# Patient Record
Sex: Male | Born: 1970 | Race: White | Hispanic: No | Marital: Single
Health system: Midwestern US, Community
[De-identification: ages and names within clinical notes are randomized; demographics above are authoritative.]

## PROBLEM LIST (undated history)

## (undated) DIAGNOSIS — D751 Secondary polycythemia: Secondary | ICD-10-CM

---

## 2014-12-07 ENCOUNTER — Inpatient Hospital Stay
Admit: 2014-12-07 | Discharge: 2014-12-07 | Disposition: A | Discharge status: Home / Self Care | Payer: Self-pay | Attending: Emergency Medicine

## 2014-12-07 DIAGNOSIS — M545 Low back pain: Secondary | ICD-10-CM

## 2014-12-07 LAB — METABOLIC PANEL, COMPREHENSIVE
A-G Ratio: 1.1 — ABNORMAL LOW (ref 1.2–3.5)
ALT (SGPT): 34 U/L (ref 12–65)
AST (SGOT): 22 U/L (ref 15–37)
Albumin: 3.2 g/dL — ABNORMAL LOW (ref 3.5–5.0)
Alk. phosphatase: 72 U/L (ref 50–136)
Anion gap: 5 mmol/L — ABNORMAL LOW (ref 7–16)
BUN: 11 MG/DL (ref 6–23)
Bilirubin, total: 0.5 MG/DL (ref 0.2–1.1)
CO2: 26 mmol/L (ref 21–32)
Calcium: 8.1 MG/DL — ABNORMAL LOW (ref 8.3–10.4)
Chloride: 113 mmol/L — ABNORMAL HIGH (ref 98–107)
Creatinine: 1.3 MG/DL (ref 0.8–1.5)
GFR est AA: >60 mL/min/{1.73_m2} (ref >60)
GFR est non-AA: >60 mL/min/{1.73_m2} (ref >60)
Globulin: 2.9 g/dL (ref 2.3–3.5)
Glucose: 119 mg/dL — ABNORMAL HIGH (ref 65–100)
Potassium: 3.6 mmol/L (ref 3.5–5.1)
Protein, total: 6.1 g/dL — ABNORMAL LOW (ref 6.3–8.2)
Sodium: 144 mmol/L (ref 136–145)

## 2014-12-07 LAB — CBC WITH AUTOMATED DIFF
ABS. BASOPHILS: 0 10*3/uL (ref 0.0–0.2)
ABS. EOSINOPHILS: 0.2 10*3/uL (ref 0.0–0.8)
ABS. IMM. GRANS.: 0 10*3/uL (ref 0.0–0.5)
ABS. LYMPHOCYTES: 2.4 10*3/uL (ref 0.5–4.6)
ABS. MONOCYTES: 0.6 10*3/uL (ref 0.1–1.3)
ABS. NEUTROPHILS: 5.2 10*3/uL (ref 1.7–8.2)
BASOPHILS: 0 % (ref 0.0–2.0)
EOSINOPHILS: 3 % (ref 0.5–7.8)
HCT: 47.6 % (ref 41.1–50.3)
HGB: 16.2 g/dL (ref 13.6–17.2)
IMMATURE GRANULOCYTES: 0.1 % (ref 0.0–5.0)
LYMPHOCYTES: 28 % (ref 13–44)
MCH: 30.3 PG (ref 26.1–32.9)
MCHC: 34 g/dL (ref 31.4–35.0)
MCV: 89 FL (ref 79.6–97.8)
MONOCYTES: 7 % (ref 4.0–12.0)
MPV: 10 FL — ABNORMAL LOW (ref 10.8–14.1)
NEUTROPHILS: 62 % (ref 43–78)
PLATELET: 233 10*3/uL (ref 150–450)
RBC: 5.35 M/uL (ref 4.23–5.67)
RDW: 13.3 % (ref 11.9–14.6)
WBC: 8.4 10*3/uL (ref 4.3–11.1)

## 2014-12-07 MED ORDER — SODIUM CHLORIDE 0.9 % IJ SYRG
INTRAMUSCULAR | Status: DC | PRN
Start: 2014-12-07 — End: 2014-12-07

## 2014-12-07 MED ORDER — SODIUM CHLORIDE 0.9 % IJ SYRG
Freq: Three times a day (TID) | INTRAMUSCULAR | Status: DC
Start: 2014-12-07 — End: 2014-12-07

## 2014-12-07 NOTE — ED Notes (Addendum)
Reports woke up at 5:35 with numbness to both legs.  EMS reports patient ambulatory on scene.  Patient reports difficulty getting legs to function.

## 2014-12-07 NOTE — ED Provider Notes (Addendum)
Patient is a 44 y.o. male presenting with numbness and anal bleeding. The history is provided by the patient.   Numbness  This is a new problem. The current episode started 6 to 12 hours ago. The problem has not changed since onset.There was right lower extremity and left lower extremity focality noted. Primary symptoms include focal weakness (bilateral lower extremity weakness).There has been no fever. Pertinent negatives include no shortness of breath, no chest pain, no vomiting, no headaches and no nausea. Associated symptoms comments: Chronic low back pain.   Rectal Bleeding   This is a new problem. Episode onset: 2 weeks ago. Stool description: Black. Pertinent negatives include no abdominal pain, no dysuria, no chills, no fever, no nausea, no back pain, no vomiting and no diarrhea. Risk factors: patient gives a history of peptic ulcer disease.        Past Medical History:   Diagnosis Date   ??? Psychiatric disorder    ??? PUD (peptic ulcer disease)    ??? Ill-defined condition        Past Surgical History:   Procedure Laterality Date   ??? Hx heent       eye         History reviewed. No pertinent family history.    History     Social History   ??? Marital Status: N/A     Spouse Name: N/A   ??? Number of Children: N/A   ??? Years of Education: N/A     Occupational History   ??? Not on file.     Social History Main Topics   ??? Smoking status: Never Smoker    ??? Smokeless tobacco: Not on file   ??? Alcohol Use: Yes   ??? Drug Use: Yes     Special: Marijuana   ??? Sexual Activity: Not on file     Other Topics Concern   ??? Not on file     Social History Narrative   ??? No narrative on file           ALLERGIES: Seroquel and Trazodone      Review of Systems   Constitutional: Negative for fever and chills.   HENT: Negative for congestion, rhinorrhea and sore throat.    Eyes: Negative for photophobia and redness.   Respiratory: Negative for cough and shortness of breath.    Cardiovascular: Negative for chest pain and leg swelling.    Gastrointestinal: Positive for anal bleeding. Negative for nausea, vomiting, abdominal pain and diarrhea.   Endocrine: Negative for polydipsia and polyuria.   Genitourinary: Negative for dysuria, urgency, frequency and difficulty urinating.        Denies urinary retention   Musculoskeletal: Positive for gait problem. Negative for myalgias, back pain and neck pain.   Neurological: Positive for focal weakness (bilateral lower extremity weakness) and numbness. Negative for weakness and headaches.       Filed Vitals:    12/07/14 0917   BP: 127/70   Pulse: 61   Temp: 98 ??F (36.7 ??C)   Resp: 18   Height: '5\' 10"'  (1.778 m)   Weight: 97.523 kg (215 lb)   SpO2: 94%            Physical Exam   Constitutional: He is oriented to person, place, and time. He appears well-developed and well-nourished.   HENT:   Mouth/Throat: Oropharynx is clear and moist. No oropharyngeal exudate.   Eyes: Conjunctivae are normal. Pupils are equal, round, and reactive to light.   Neck:  Neck supple.   Cardiovascular: Normal rate, regular rhythm and normal heart sounds.    Pulmonary/Chest: Effort normal and breath sounds normal.   Abdominal: Soft. Bowel sounds are normal. He exhibits no distension. There is no tenderness. There is no rebound and no guarding.   Genitourinary: Guaiac negative stool.   Musculoskeletal: Normal range of motion. He exhibits no edema or tenderness.   Lymphadenopathy:     He has no cervical adenopathy.   Neurological: He is alert and oriented to person, place, and time. No sensory deficit.   Reflex Scores:       Patellar reflexes are 2+ on the right side and 2+ on the left side.       Achilles reflexes are 2+ on the right side and 2+ on the left side.  Diffuse 4 out of 5 weakness bilateral lower extremities versus poor effort.  Patient complains of numbness but is able to feel sharp pain bilaterally.   Skin: Skin is warm and dry.   Nursing note and vitals reviewed.       MDM  Number of Diagnoses or Management Options   Diagnosis management comments: Patient with a history of chronic back pain and on gabapentin for this.  He claims the numbness and weakness are new.  He has no sensory deficit that I can tell clinically and has normal rectal tone and normal reflexes.  His weakness is  bilateral and symmetric.  I will try to reach his physician to see if an outpatient MRI can be ordered.  Patient is able to ambulate.    9:59 AM  The patient had told me his primary care physician was Dr. Madison Hickman.  When my secretary went to speak to him to confirm this name and call her he told the secretary that he did not have a primary care physician.  He has not seen her for 5 years.  And now the patient admits when asked that he has only been in town for 5 days and came here from [place] and is staying at the [facility].    10:34 AM  There are no physical findings consistent with spinal cord emergency.  Patient gave a history of black stool for 2 weeks but has brown stool that is heme negative and a normal hemoglobin.  He has been evasive in his answers to questions regarding his previous care.  Patient will be discharged back to the [facility].  We'll refer him to the free clinic.       Amount and/or Complexity of Data Reviewed  Clinical lab tests: ordered and reviewed (Results for orders placed or performed during the hospital encounter of 12/07/14  -CBC WITH AUTOMATED DIFF       Result                                            Value                         Ref Range                       WBC  8.4                           4.3 - 11.1 K/uL                 RBC                                               5.35                          4.23 - 5.67 M/uL                HGB                                               16.2                          13.6 - 17.2 g/dL                HCT                                               47.6                          41.1 - 50.3 %                    MCV                                               89.0                          79.6 - 97.8 FL                  MCH                                               30.3                          26.1 - 32.9 PG                  MCHC                                              34.0                          31.4 - 35.0 g/dL  RDW                                               13.3                          11.9 - 14.6 %                   PLATELET                                          233                           150 - 450 K/uL                  MPV                                               10.0 (L)                      10.8 - 14.1 FL                  DF                                                AUTOMATED                                                     NEUTROPHILS                                       62                            43 - 78 %                       LYMPHOCYTES                                       28                            13 - 44 %                       MONOCYTES  7                             4.0 - 12.0 %                    EOSINOPHILS                                       3                             0.5 - 7.8 %                     BASOPHILS                                         0                             0.0 - 2.0 %                     IMMATURE GRANULOCYTES                             0.1                           0.0 - 5.0 %                     ABS. NEUTROPHILS                                  5.2                           1.7 - 8.2 K/UL                  ABS. LYMPHOCYTES                                  2.4                           0.5 - 4.6 K/UL                  ABS. MONOCYTES                                    0.6                           0.1 - 1.3 K/UL                  ABS. EOSINOPHILS  0.2                           0.0 - 0.8 K/UL                   ABS. BASOPHILS                                    0.0                           0.0 - 0.2 K/UL                  ABS. IMM. GRANS.                                  0.0                           0.0 - 0.5 K/UL             -METABOLIC PANEL, COMPREHENSIVE       Result                                            Value                         Ref Range                       Sodium                                            144                           136 - 145 mmol/L                Potassium                                         3.6                           3.5 - 5.1 mmol/L                Chloride                                          113 (H)                       98 - 107 mmol/L                 CO2  26                            21 - 32 mmol/L                  Anion gap                                         5 (L)                         7 - 16 mmol/L                   Glucose                                           119 (H)                       65 - 100 mg/dL                  BUN                                               11                            6 - 23 MG/DL                    Creatinine                                        1.30                          0.8 - 1.5 MG/DL                 GFR est AA                                        >60                           >60 ml/min/1.30m               GFR est non-AA                                    >60                           >60 ml/min/1.747m              Calcium  8.1 (L)                       8.3 - 10.4 MG/DL                Bilirubin, total                                  0.5                           0.2 - 1.1 MG/DL                 ALT                                               34                            12 - 65 U/L                     AST                                               22                            15 - 37 U/L                      Alk. phosphatase                                  72                            50 - 136 U/L                    Protein, total                                    6.1 (L)                       6.3 - 8.2 g/dL                  Albumin                                           3.2 (L)                       3.5 - 5.0 g/dL                  Globulin  2.9                           2.3 - 3.5 g/dL                  A-G Ratio                                         1.1 (L)                       1.2 - 3.5                  )        Procedures

## 2014-12-07 NOTE — ED Notes (Signed)
I have reviewed discharge instructions with the patient.  The patient verbalized understanding.

## 2018-08-29 DIAGNOSIS — R0789 Other chest pain: Secondary | ICD-10-CM | POA: Insufficient documentation

## 2018-08-30 ENCOUNTER — Encounter (HOSPITAL_COMMUNITY): Payer: Self-pay | Admitting: Emergency Medicine

## 2018-08-30 ENCOUNTER — Emergency Department (HOSPITAL_COMMUNITY): Payer: Self-pay

## 2018-08-30 ENCOUNTER — Emergency Department (HOSPITAL_COMMUNITY)
Admission: EM | Admit: 2018-08-30 | Discharge: 2018-08-30 | Disposition: A | Discharge status: Home / Self Care | Payer: Self-pay | Attending: Emergency Medicine | Admitting: Emergency Medicine

## 2018-08-30 DIAGNOSIS — R0789 Other chest pain: Secondary | ICD-10-CM

## 2018-08-30 HISTORY — DX: Secondary polycythemia: D75.1

## 2018-08-30 LAB — CBC
HCT: 49.6 % (ref 39.0–52.0)
Hemoglobin: 16.4 g/dL (ref 13.0–17.0)
MCH: 27.7 pg (ref 26.0–34.0)
MCHC: 33.1 g/dL (ref 30.0–36.0)
MCV: 83.8 fL (ref 80.0–100.0)
Platelets: 273 10*3/uL (ref 150–400)
RBC: 5.92 MIL/uL — AB (ref 4.22–5.81)
RDW: 15 % (ref 11.5–15.5)
WBC: 7.7 10*3/uL (ref 4.0–10.5)
nRBC: 0 % (ref 0.0–0.2)

## 2018-08-30 LAB — BASIC METABOLIC PANEL
Anion gap: 10 (ref 5–15)
BUN: 16 mg/dL (ref 6–20)
CALCIUM: 9.8 mg/dL (ref 8.9–10.3)
CO2: 24 mmol/L (ref 22–32)
Chloride: 104 mmol/L (ref 98–111)
Creatinine, Ser: 1.16 mg/dL (ref 0.61–1.24)
GFR calc Af Amer: >60 mL/min (ref >60)
GFR calc non Af Amer: >60 mL/min (ref >60)
Glucose, Bld: 101 mg/dL — ABNORMAL HIGH (ref 70–99)
Potassium: 4 mmol/L (ref 3.5–5.1)
Sodium: 138 mmol/L (ref 135–145)

## 2018-08-30 LAB — POCT I-STAT TROPONIN I
Troponin i, poc: 0.02 ng/mL (ref 0.00–0.08)
Troponin i, poc: 0 ng/mL (ref 0.00–0.08)

## 2018-08-30 NOTE — ED Notes (Signed)
Pt recently moved here from [place].Pt has c/o left chest pain that began today. Pt smokes but denies htn or dm. Pt denies radiation of pain. Pt states pain was 7/10 but is currently 0. Pt has hx of polycythemia. Pt has not had his blood levels checked in over 3 months and he is supposed to have them drawn every 6 weeks.  Pt stated he is in recovery and can not take pain medication

## 2018-08-30 NOTE — ED Notes (Signed)
Pt waiting to speak with provider

## 2018-08-30 NOTE — ED Provider Notes (Signed)
[facility]-EMERGENCY DEPT Provider Note  CSN: [id number] Arrival date & time: 08/29/18 2345  Chief Complaint(s) No chief complaint on file.  HPI Tag Wurtz is a 47 y.o. male   The history is provided by the patient.  Chest Pain   This is a recurrent problem. Episode frequency: intermittent. The problem has been resolved. Associated with: lying. The pain is present in the substernal region. The pain is moderate. The quality of the pain is described as dull. The pain does not radiate. Exacerbated by: lying. Treatments tried: sitting up. The treatment provided significant relief. Risk factors include male gender and smoking/tobacco exposure.  Pertinent negatives for past medical history include no cancer, no COPD, no diabetes, no DVT, no hyperlipidemia, no hypertension, no MI, no PE, no strokes and no TIA.    Past Medical History Past Medical History:  Diagnosis Date  . Polycythemia    There are no active problems to display for this patient.  Home Medication(s) Prior to Admission medications   Not on File                                                                                                                                    Past Surgical History History reviewed. No pertinent surgical history. Family History No family history on file.  Social History Social History   Tobacco Use  . Smoking status: Not on file  Substance Use Topics  . Alcohol use: Not on file  . Drug use: Not on file   Allergies Patient has no allergy information on record.  Review of Systems Review of Systems  Cardiovascular: Positive for chest pain.   All other systems are reviewed and are negative for acute change except as noted in the HPI  Physical Exam Vital Signs  I have reviewed the triage vital signs BP 126/76 (BP Location: Right Arm)   Pulse 86   Temp 97.9 F (36.6 C) (Oral)   Resp 17   Ht 5\' 10"  (1.778 m)   Wt 95.3 kg   SpO2 97%   BMI 30.13 kg/m    Physical Exam  Constitutional: He is oriented to person, place, and time. He appears well-developed and well-nourished. No distress.  HENT:  Head: Normocephalic and atraumatic.  Nose: Nose normal.  Eyes: Pupils are equal, round, and reactive to light. Conjunctivae and EOM are normal. Right eye exhibits no discharge. Left eye exhibits no discharge. No scleral icterus.  Neck: Normal range of motion. Neck supple.  Cardiovascular: Normal rate and regular rhythm. Exam reveals no gallop and no friction rub.  No murmur heard. Pulmonary/Chest: Effort normal and breath sounds normal. No stridor. No respiratory distress. He has no rales.  Abdominal: Soft. He exhibits no distension. There is no tenderness.  Musculoskeletal: He exhibits no edema or tenderness.  Neurological: He is alert and oriented to person, place, and time.  Skin: Skin is warm and dry.  No rash noted. He is not diaphoretic. No erythema.  Psychiatric: He has a normal mood and affect.  Vitals reviewed.   ED Results and Treatments Labs (all labs ordered are listed, but only abnormal results are displayed) Labs Reviewed  BASIC METABOLIC PANEL - Abnormal; Notable for the following components:      Result Value   Glucose, Bld 101 (*)    All other components within normal limits  CBC - Abnormal; Notable for the following components:   RBC 5.92 (*)    All other components within normal limits  I-STAT TROPONIN, ED  POCT I-STAT TROPONIN I  I-STAT TROPONIN, ED  POCT I-STAT TROPONIN I                                                                                                                         EKG  EKG Interpretation  Date/Time:  Friday August 29 2018 23:56:23 EST Ventricular Rate:  83 PR Interval:    QRS Duration: 108 QT Interval:  382 QTC Calculation: 449 R Axis:   -53 Text Interpretation:  Sinus rhythm LAD, consider left anterior fascicular block Abnormal R-wave progression, late transition No old tracing to  compare Confirmed by Mancel BaleWentz, Elliott [id number]) on 08/30/2018 12:08:41 AM      Radiology Dg Chest 2 View  Result Date: 08/30/2018 CLINICAL DATA:  Chest pain. EXAM: CHEST - 2 VIEW COMPARISON:  None. FINDINGS: Normal mediastinum and cardiac silhouette. Normal pulmonary vasculature. No evidence of effusion, infiltrate, or pneumothorax. No acute bony abnormality. IMPRESSION: No acute cardiopulmonary process. Electronically Signed   By: Genevive BiStewart  Edmunds M.D.   On: 08/30/2018 01:14   Pertinent labs & imaging results that were available during my care of the patient were reviewed by me and considered in my medical decision making (see chart for details).  Medications Ordered in ED Medications - No data to display                                                                                                                                  Procedures Procedures  (including critical care time)  Medical Decision Making / ED Course I have reviewed the nursing notes for this encounter and the patient's prior records (if available in EHR or on provided paperwork).    Highly atypical chest pain inconsistent with ACS.  Likely GI related.  EKG without acute ischemic changes or evidence of pericarditis.  Initial troponin negative.  Heart score less than 4.  Appropriate for delta troponin.  Delta Trop negative  Patient has a history of polycythemia.  CBC with mildly elevated RBCs otherwise reassuring.  Doubt pulmonary embolism.  Presentation not classic for aortic dissection or esophageal perforation.  Chest x-ray without evidence suggestive of pneumonia, pneumothorax, pneumomediastinum.  No abnormal contour of the mediastinum to suggest dissection. No evidence of acute injuries.   Final Clinical Impression(s) / ED Diagnoses Final diagnoses:  Atypical chest pain    Disposition: Discharge  Condition: Good  I have discussed the results, Dx and Tx plan with the patient who expressed understanding  and agree(s) with the plan. Discharge instructions discussed at great length. The patient was given strict return precautions who verbalized understanding of the instructions. No further questions at time of discharge.    ED Discharge Orders         Ordered    Ambulatory referral to Hematology    Comments:  History of polycythemia.  Establish care for therapeutic phlebotomy.   08/30/18 0320           Follow Up: [facility] [address] [place] [address] [phone] Schedule an appointment as soon as possible for a visit       This chart was dictated using voice recognition software.  Despite best efforts to proofread,  errors can occur which can change the documentation meaning.   Nira Conn, MD 08/30/18 [phone]

## 2018-08-31 ENCOUNTER — Other Ambulatory Visit: Payer: Self-pay

## 2018-08-31 ENCOUNTER — Emergency Department (HOSPITAL_COMMUNITY): Payer: Medicaid Other

## 2018-08-31 ENCOUNTER — Encounter (HOSPITAL_COMMUNITY): Payer: Self-pay | Admitting: Emergency Medicine

## 2018-08-31 ENCOUNTER — Emergency Department (HOSPITAL_COMMUNITY)
Admission: EM | Admit: 2018-08-31 | Discharge: 2018-08-31 | Disposition: A | Discharge status: Home / Self Care | Payer: Medicaid Other | Attending: Emergency Medicine | Admitting: Emergency Medicine

## 2018-08-31 DIAGNOSIS — D751 Secondary polycythemia: Secondary | ICD-10-CM | POA: Insufficient documentation

## 2018-08-31 DIAGNOSIS — R110 Nausea: Secondary | ICD-10-CM | POA: Insufficient documentation

## 2018-08-31 DIAGNOSIS — R079 Chest pain, unspecified: Secondary | ICD-10-CM

## 2018-08-31 DIAGNOSIS — R0789 Other chest pain: Secondary | ICD-10-CM | POA: Insufficient documentation

## 2018-08-31 DIAGNOSIS — R0600 Dyspnea, unspecified: Secondary | ICD-10-CM | POA: Insufficient documentation

## 2018-08-31 DIAGNOSIS — F17210 Nicotine dependence, cigarettes, uncomplicated: Secondary | ICD-10-CM | POA: Insufficient documentation

## 2018-08-31 LAB — BASIC METABOLIC PANEL
Anion gap: 9 (ref 5–15)
BUN: 11 mg/dL (ref 6–20)
CO2: 23 mmol/L (ref 22–32)
Calcium: 9.5 mg/dL (ref 8.9–10.3)
Chloride: 106 mmol/L (ref 98–111)
Creatinine, Ser: 1.3 mg/dL — ABNORMAL HIGH (ref 0.61–1.24)
GFR calc Af Amer: >60 mL/min (ref >60)
GFR calc non Af Amer: >60 mL/min (ref >60)
Glucose, Bld: 101 mg/dL — ABNORMAL HIGH (ref 70–99)
POTASSIUM: 3.9 mmol/L (ref 3.5–5.1)
Sodium: 138 mmol/L (ref 135–145)

## 2018-08-31 LAB — PROTIME-INR
INR: 0.97
Prothrombin Time: 12.8 seconds (ref 11.4–15.2)

## 2018-08-31 LAB — CBC
HCT: 50.5 % (ref 39.0–52.0)
HEMOGLOBIN: 16.2 g/dL (ref 13.0–17.0)
MCH: 26.6 pg (ref 26.0–34.0)
MCHC: 32.1 g/dL (ref 30.0–36.0)
MCV: 83.1 fL (ref 80.0–100.0)
Platelets: 274 10*3/uL (ref 150–400)
RBC: 6.08 MIL/uL — ABNORMAL HIGH (ref 4.22–5.81)
RDW: 15.1 % (ref 11.5–15.5)
WBC: 7.4 10*3/uL (ref 4.0–10.5)
nRBC: 0 % (ref 0.0–0.2)

## 2018-08-31 LAB — D-DIMER, QUANTITATIVE (NOT AT ARMC): D DIMER QUANT: 0.4 ug{FEU}/mL (ref 0.00–0.50)

## 2018-08-31 LAB — I-STAT TROPONIN, ED: Troponin i, poc: 0.01 ng/mL (ref 0.00–0.08)

## 2018-08-31 NOTE — ED Triage Notes (Signed)
Patient arrived with EMS from home reports left side chest pain with mild SOB and nausea onset this evening , denies diaphoresis or cough . He received 1 NTG sl with mild relief .

## 2018-08-31 NOTE — ED Provider Notes (Signed)
[facility] Provider Note   CSN: [id number] Arrival date & time: 08/31/18  0243     History   Chief Complaint Chief Complaint  Patient presents with  . Chest Pain    HPI Tyrone Jackson is a 47 y.o. male.  The history is provided by the patient.  He has a history of polycythemia and states that his hematologist never lets his hematocrit get over 42.  He woke up 2 hours ago with sharp left-sided chest pain which he feels is related to his blood count getting too high.  Pain is rated at 5/10.  There is associated dyspnea, nausea, diaphoresis.  It is worse when he moves his left arm but not when he takes a deep breath.  He has not done anything to treat the pain.  He had been in the emergency department yesterday with similar pain and was upset that he did not have therapeutic phlebotomy at that time.  He does smoke and has history of hyperlipidemia.  He denies history of hypertension or diabetes.  There is a family history of premature coronary atherosclerosis.  Past Medical History:  Diagnosis Date  . Polycythemia     There are no active problems to display for this patient.   History reviewed. No pertinent surgical history.      Home Medications    Prior to Admission medications   Not on File    Family History No family history on file.  Social History Social History   Tobacco Use  . Smoking status: Current Some Day Smoker  . Smokeless tobacco: Never Used  Substance Use Topics  . Alcohol use: Yes    Frequency: Never  . Drug use: Never     Allergies   Patient has no known allergies.   Review of Systems Review of Systems  All other systems reviewed and are negative.    Physical Exam Updated Vital Signs BP 110/81 (BP Location: Right Arm)   Pulse 77   Temp 97.7 F (36.5 C) (Oral)   Resp 16   Ht 5\' 10"  (1.778 m)   Wt 90.7 kg   SpO2 98%   BMI 28.70 kg/m   Physical Exam  Nursing note and vitals  reviewed.  47 year old male, resting comfortably and in no acute distress. Vital signs are normal. Oxygen saturation is 98%, which is normal. Head is normocephalic and atraumatic. PERRLA, EOMI. Oropharynx is clear. Neck is nontender and supple without adenopathy or JVD. Back is nontender and there is no CVA tenderness. Lungs are clear without rales, wheezes, or rhonchi. Chest is mildly tender in the left anterior rib cage.  There is no crepitus.Marland Kitchen. Heart has regular rate and rhythm without murmur. Abdomen is soft, flat, nontender without masses or hepatosplenomegaly and peristalsis is normoactive. Extremities have no cyanosis or edema, full range of motion is present. Skin is warm and dry without rash. Neurologic: Mental status is normal, cranial nerves are intact, there are no motor or sensory deficits.  ED Treatments / Results  Labs (all labs ordered are listed, but only abnormal results are displayed) Labs Reviewed  BASIC METABOLIC PANEL - Abnormal; Notable for the following components:      Result Value   Glucose, Bld 101 (*)    Creatinine, Ser 1.30 (*)    All other components within normal limits  CBC - Abnormal; Notable for the following components:   RBC 6.08 (*)    All other components within normal limits  PROTIME-INR  D-DIMER, QUANTITATIVE (NOT AT [facility])  I-STAT TROPONIN, ED    EKG EKG Interpretation  Date/Time:  Sunday August 31 2018 02:47:46 EST Ventricular Rate:  83 PR Interval:    QRS Duration: 106 QT Interval:  394 QTC Calculation: 463 R Axis:   -34 Text Interpretation:  Sinus rhythm Left axis deviation Abnormal R-wave progression, late transition Low voltage QRS When compared with ECG of 08/29/2018, No significant change was found Confirmed by Dione Booze ([id number]) on 08/31/2018 2:52:39 AM   Radiology Dg Chest 2 View  Result Date: 08/31/2018 CLINICAL DATA:  47 year old male with chest pain. EXAM: CHEST - 2 VIEW COMPARISON:  Chest radiograph dated 08/30/2018  FINDINGS: The heart size and mediastinal contours are within normal limits. Both lungs are clear. The visualized skeletal structures are unremarkable. IMPRESSION: No active cardiopulmonary disease. Electronically Signed   By: Elgie Collard M.D.   On: 08/31/2018 03:51   Dg Chest 2 View  Result Date: 08/30/2018 CLINICAL DATA:  Chest pain. EXAM: CHEST - 2 VIEW COMPARISON:  None. FINDINGS: Normal mediastinum and cardiac silhouette. Normal pulmonary vasculature. No evidence of effusion, infiltrate, or pneumothorax. No acute bony abnormality. IMPRESSION: No acute cardiopulmonary process. Electronically Signed   By: Genevive Bi M.D.   On: 08/30/2018 01:14    Procedures Procedures   Medications Ordered in ED Medications - No data to display   Initial Impression / Assessment and Plan / ED Course  I have reviewed the triage vital signs and the nursing notes.  Pertinent labs & imaging results that were available during my care of the patient were reviewed by me and considered in my medical decision making (see chart for details).  Chest pain which seems somewhat atypical.  Physical findings are suggestive of musculoskeletal chest pain.  However, patient does endorse history of polycythemia.  Hemoglobin today is 16.2 with hematocrit 50.5.  While he might benefit from phlebotomy, I do not see this is an emergent procedure.  Also, polycythemia does increase risk of thrombosis.  Will screen for pulmonary embolism with d-dimer.  Old records are reviewed, and he had an ED visit yesterday for similar complaints.  Also, a heart score is 3, which puts him at low risk for major adverse cardiac events in the next 6 weeks.  Troponin and d-dimer are both normal.  Patient is noted to be sleeping.  He is in no distress.  He continues to insist that he should have phlebotomy done.  He is advised that this is an elective procedure and needs to be set up by his hematologist once he gets established.  He had been  referred to hematology from ED visit yesterday.  He is advised to use over-the-counter NSAIDs as needed, return to ED if he has any concerns.  Final Clinical Impressions(s) / ED Diagnoses   Final diagnoses:  Nonspecific chest pain  Polycythemia    ED Discharge Orders    None       Dione Booze, MD 08/31/18 [phone]

## 2018-08-31 NOTE — Discharge Instructions (Addendum)
You were referred to hematology yesterday, and you need to make an appointment with them. Phlebotomy is an elective procedure, and they can set you up for it when it is indicated. In the mean time, take ibuprofen or naproxen for the chest pain.  You are welcome to return at any time for evaluation, but phlebotomy will not be done in the Emergency Department.

## 2019-08-10 IMAGING — CR DG CHEST 2V
2 series · 2 of 2 positions shown · non-contrast
Comparison: None.

CLINICAL DATA: Chest pain.

EXAM:
CHEST - 2 VIEW

[w chest pa]
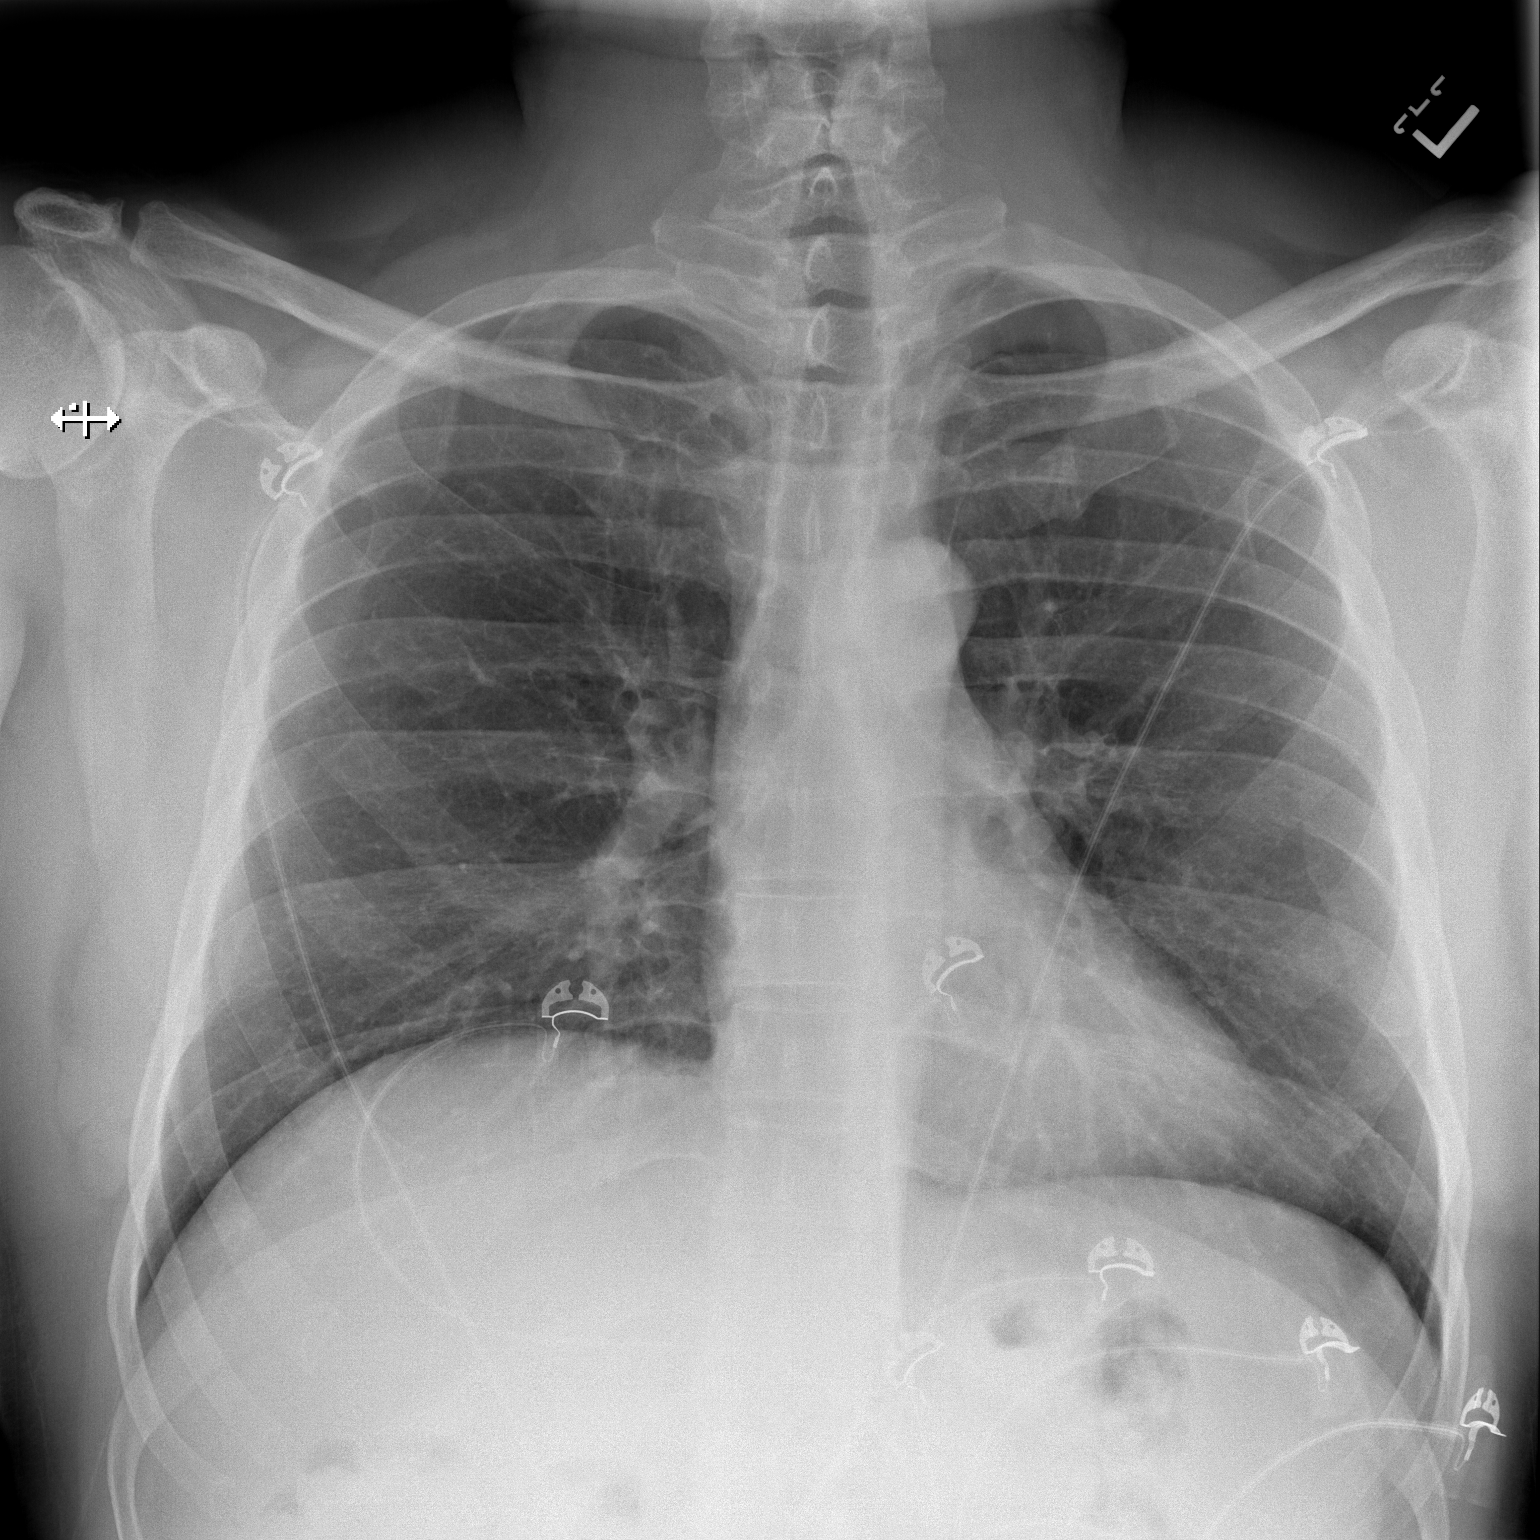

[w chest lat]
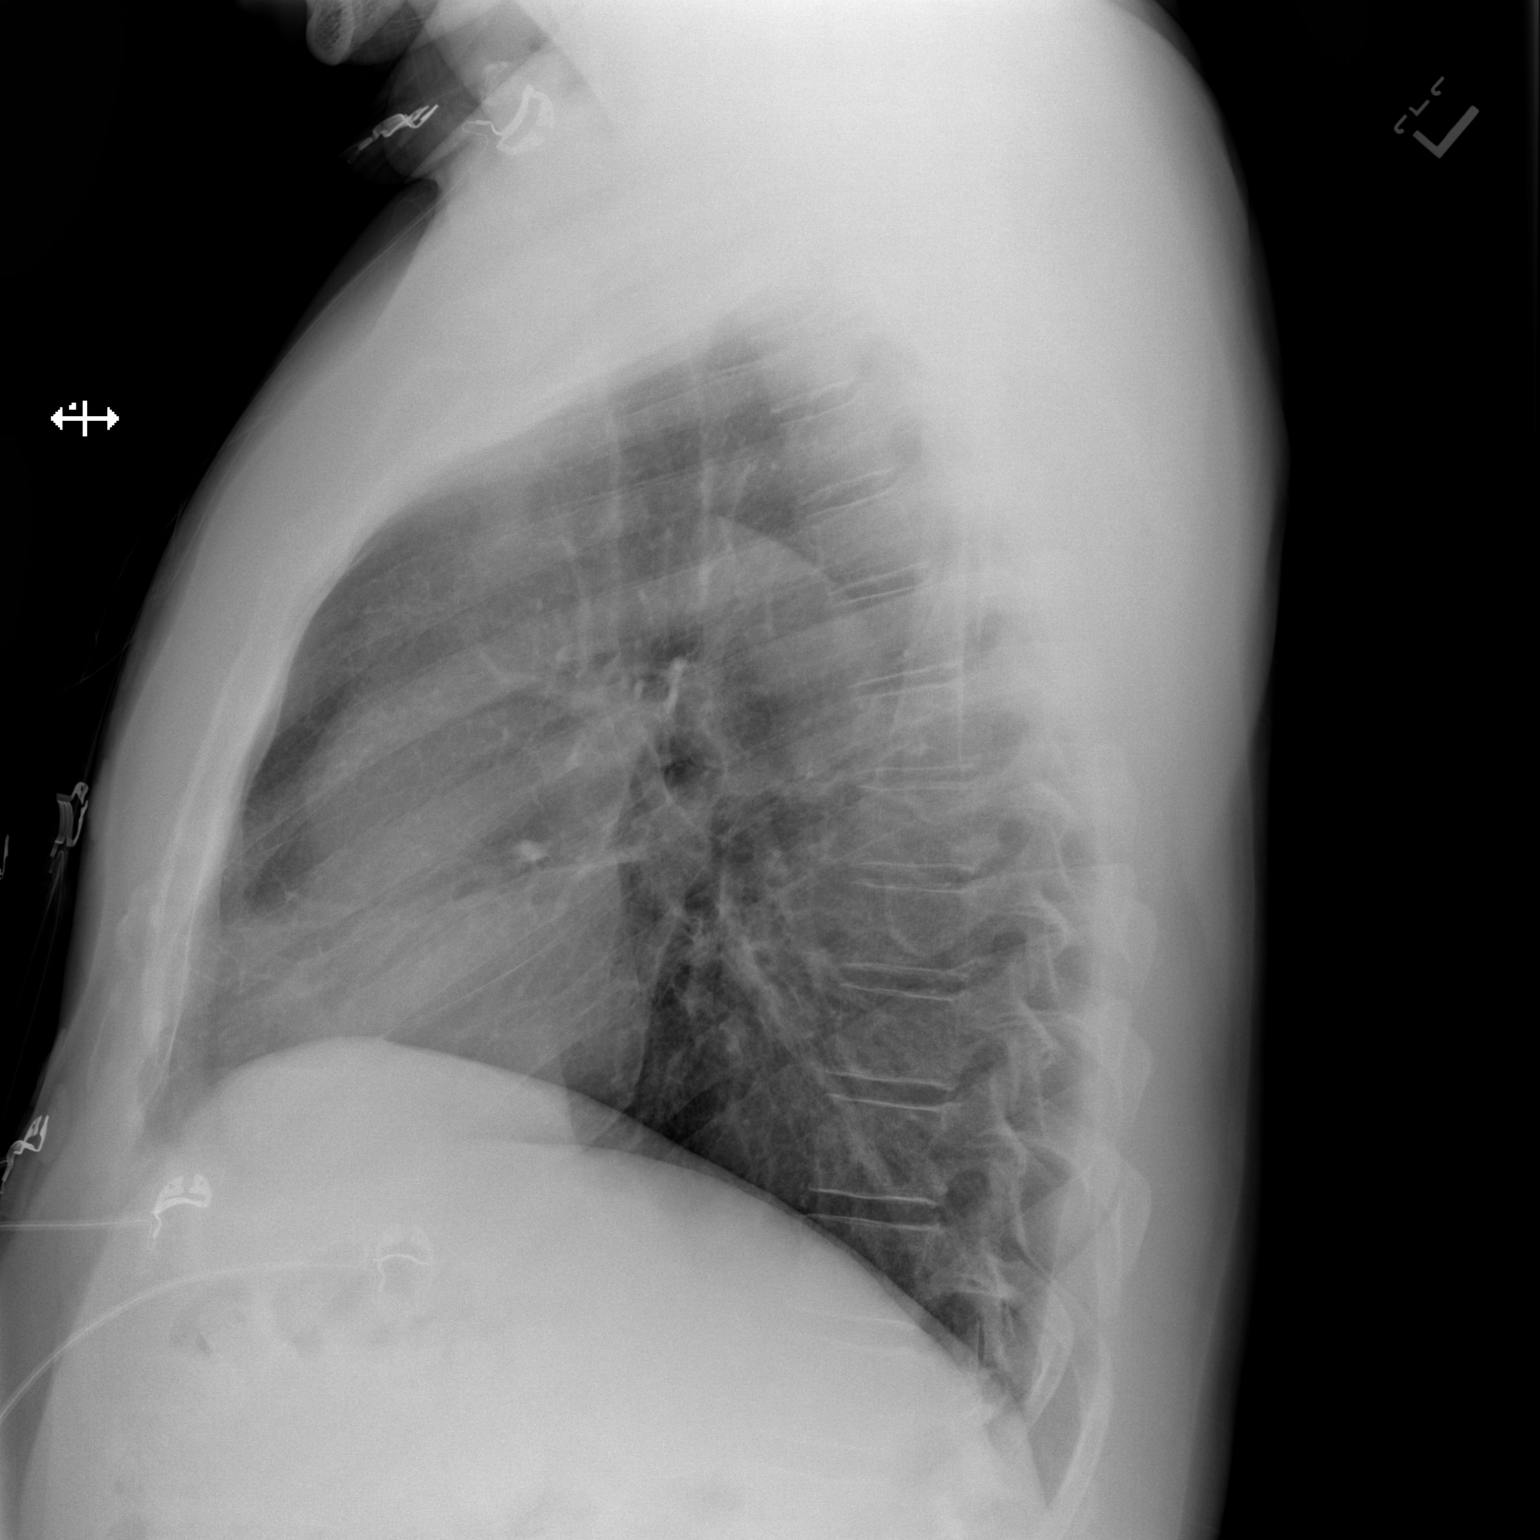

[2 of 2 positions shown; findings below may reference images not displayed]

FINDINGS: Normal mediastinum and cardiac silhouette. Normal pulmonary
vasculature. No evidence of effusion, infiltrate, or pneumothorax.
No acute bony abnormality.
IMPRESSION: No acute cardiopulmonary process.

## 2019-08-11 IMAGING — CR DG CHEST 2V
2 series · 2 of 2 positions shown · non-contrast
Comparison: Chest radiograph dated 08/30/2018

CLINICAL DATA: 47-year-old male with chest pain.

EXAM:
CHEST - 2 VIEW

[chest pa]
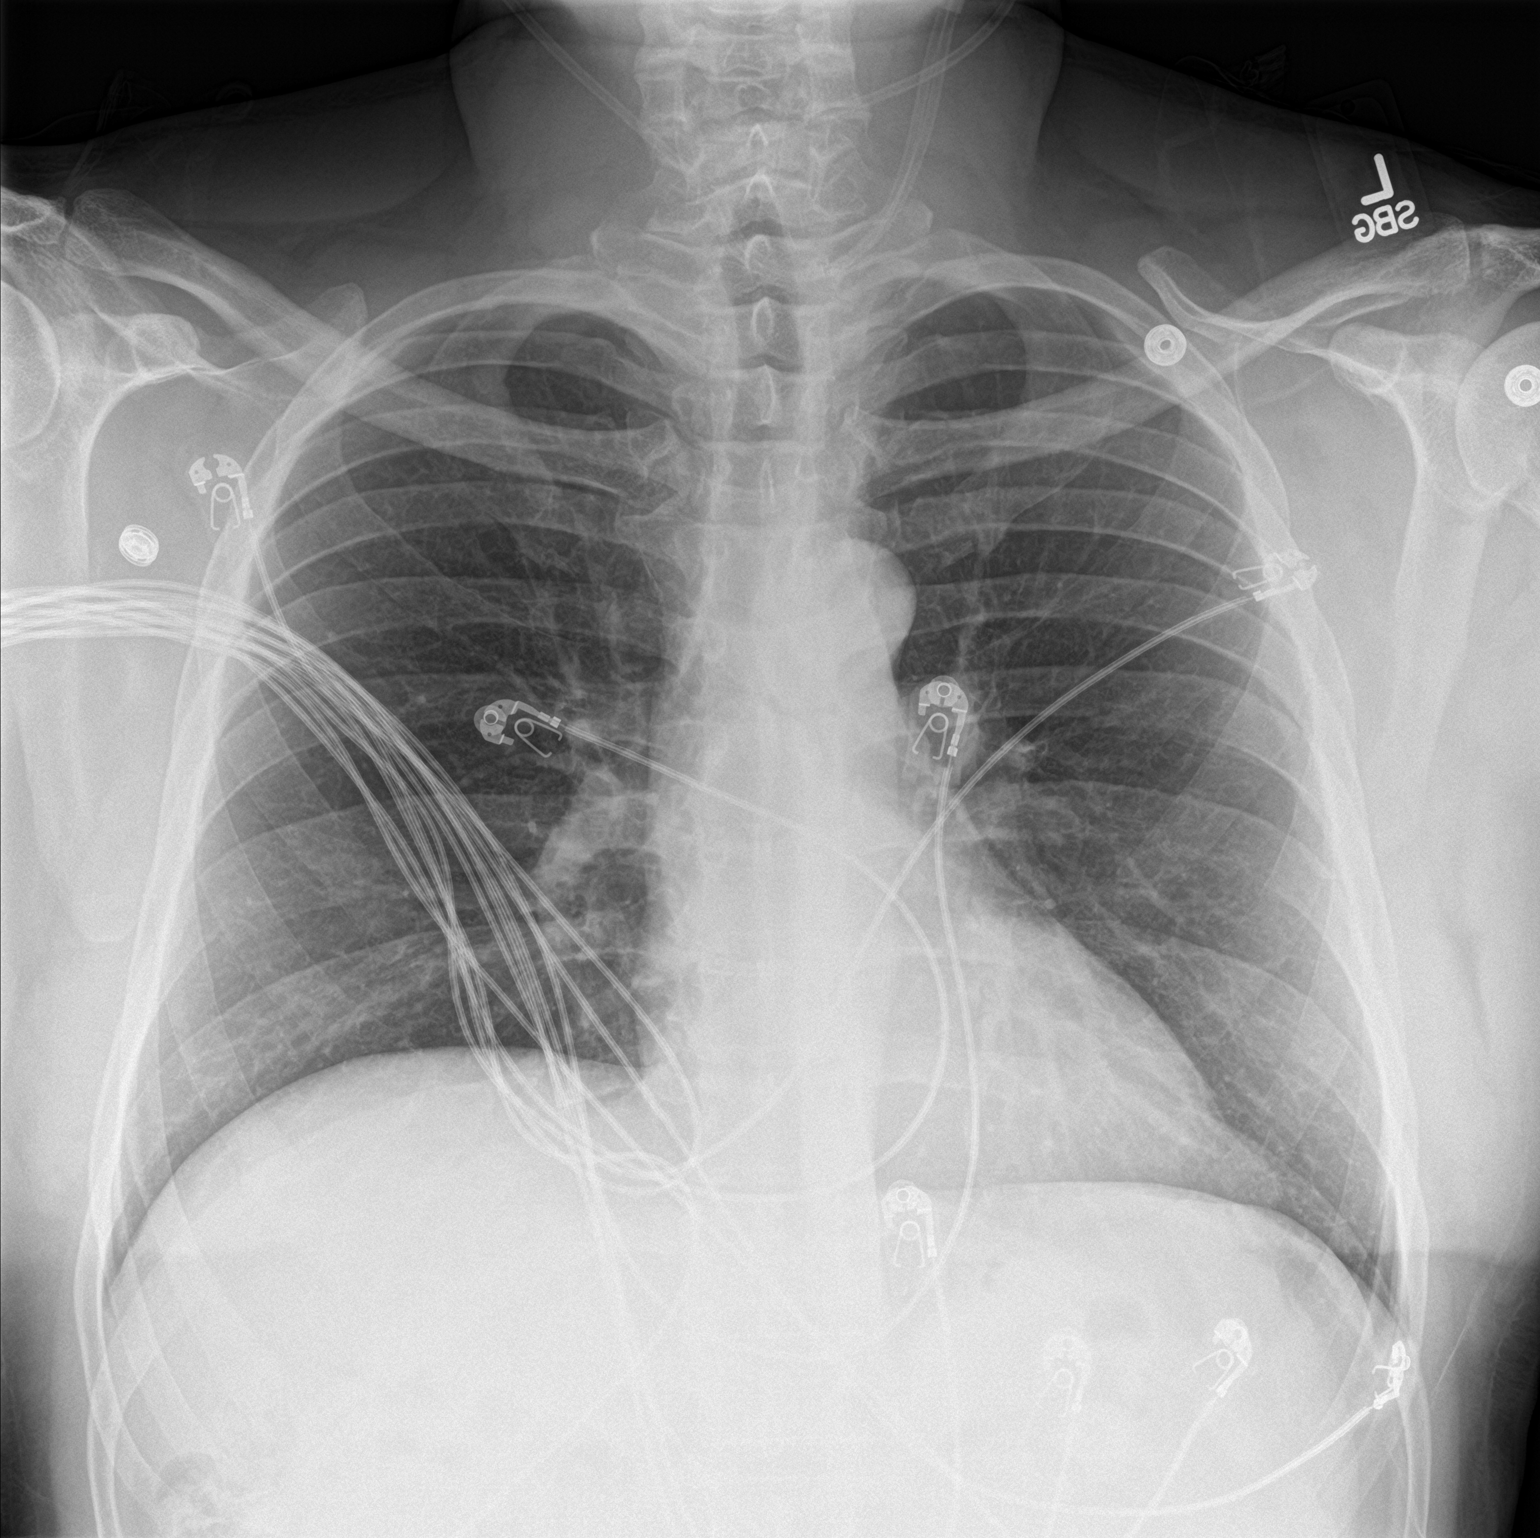

[chest lat]
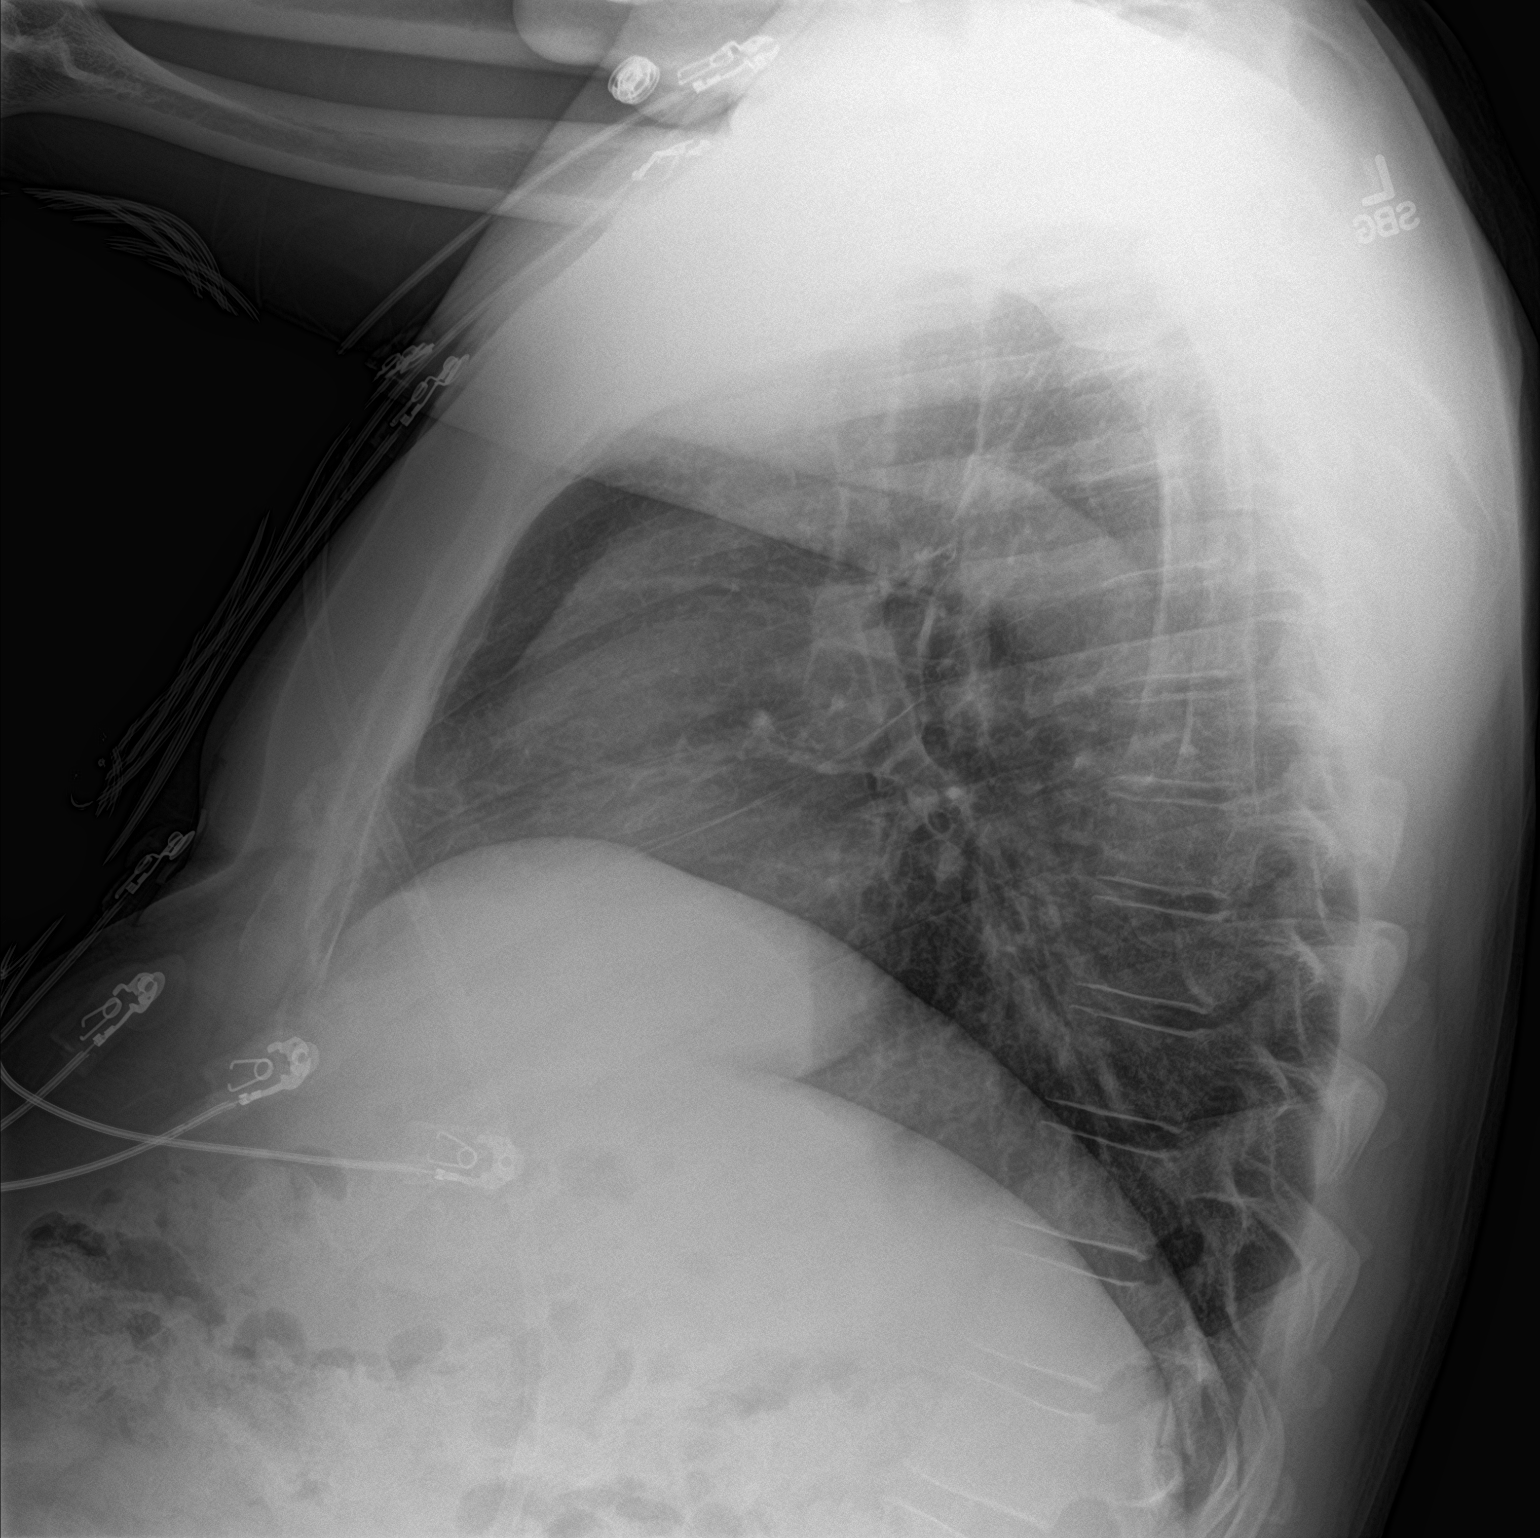

[2 of 2 positions shown; findings below may reference images not displayed]

FINDINGS: The heart size and mediastinal contours are within normal limits.
Both lungs are clear. The visualized skeletal structures are
unremarkable.
IMPRESSION: No active cardiopulmonary disease.
